# Patient Record
Sex: Male | Born: 1965
Health system: Southern US, Community
[De-identification: ages and names within clinical notes are randomized; demographics above are authoritative.]

## PROBLEM LIST (undated history)

## (undated) DIAGNOSIS — J309 Allergic rhinitis, unspecified: Secondary | ICD-10-CM

## (undated) HISTORY — PX: CYST EXCISION: SHX5701

## (undated) HISTORY — DX: Allergic rhinitis, unspecified: J30.9

---

## 2019-12-19 ENCOUNTER — Encounter: Payer: Self-pay | Admitting: Allergy and Immunology

## 2019-12-19 ENCOUNTER — Other Ambulatory Visit: Payer: Self-pay

## 2019-12-19 ENCOUNTER — Ambulatory Visit (INDEPENDENT_AMBULATORY_CARE_PROVIDER_SITE_OTHER): Payer: 59 | Admitting: Allergy and Immunology

## 2019-12-19 VITALS — BP 134/84 | HR 78 | Temp 98.1°F | Resp 18 | Ht 73.5 in | Wt 232.0 lb

## 2019-12-19 DIAGNOSIS — J301 Allergic rhinitis due to pollen: Secondary | ICD-10-CM

## 2019-12-19 DIAGNOSIS — H1010 Acute atopic conjunctivitis, unspecified eye: Secondary | ICD-10-CM

## 2019-12-19 DIAGNOSIS — J3089 Other allergic rhinitis: Secondary | ICD-10-CM | POA: Diagnosis not present

## 2019-12-19 MED ORDER — METHYLPREDNISOLONE ACETATE 80 MG/ML IJ SUSP
80.0000 mg | Freq: Once | INTRAMUSCULAR | Status: AC
  Administered 2019-12-19: 80 mg via INTRAMUSCULAR

## 2019-12-19 NOTE — Patient Instructions (Addendum)
  1.  Allergen avoidance measures.  Sports glasses when outdoors  2.  Treat and prevent inflammation:   A.  OTC Nasacort - 1-2 sprays each nostril 3-7 times a week  B.  Depo-Medrol 80 IM delivered in clinic today  3.  If needed:   A.  OTC antihistamine -cetirizine 10 mg 1 time per day  B.  OTC Pataday -1 drop each eye 1 time per day  4.  Consider a course of immunotherapy  5.  Obtain Covid vaccine when available  6.  Return to clinic in 6 months or earlier if problem

## 2019-12-19 NOTE — Progress Notes (Signed)
[place] - [place]   NEW PATIENT NOTE  Referring Provider: No ref. provider found Primary Provider: Nicoletta Dress, MD Date of office visit: 12/19/2019    Subjective:   Chief Complaint:  David Flores (DOB: [birth year]) is a 54 y.o. male who presents to the clinic on 12/19/2019 with a chief complaint of Allergy Testing .  HPI: David Flores presents to this clinic in evaluation of allergic disease.  For many years he has developed spring through fall nasal congestion and sneezing and itchy red watery eyes especially following exposure to pollen.  Unfortunately, because of his occupation he is exposed to extensive amounts of outdoor pollens and particulate matter throughout the year.  He has tried multiple medications in the past and has failed these medications.  He does not have any other associated atopic disease.  History reviewed. No pertinent past medical history.  History reviewed. No pertinent surgical history.  Allergies as of 12/19/2019   Not on File     Medication List    none    Review of systems negative except as noted in HPI / PMHx or noted below:  Review of Systems  Constitutional: Negative.   HENT: Negative.   Eyes: Negative.   Respiratory: Negative.   Cardiovascular: Negative.   Gastrointestinal: Negative.   Genitourinary: Negative.   Musculoskeletal: Negative.   Skin: Positive for rash (Rosacea treated at [facility] dermatology).  Neurological: Negative.   Endo/Heme/Allergies: Negative.   Psychiatric/Behavioral: Negative.     History reviewed. No pertinent family history.  Social History   Socioeconomic History  . Marital status: Married    Spouse name: Not on file  . Number of children: Not on file  . Years of education: Not on file  . Highest education level: Not on file  Occupational History  . Not on file  Tobacco Use  . Smoking status: Never Smoker  . Smokeless tobacco: Never Used  Substance and  Sexual Activity  . Alcohol use: Not Currently  . Drug use: Never  . Sexual activity: Not on file  Other Topics Concern  . Not on file  Social History Narrative  . Not on file    Environmental and Social history  Lives in a house with a dry environment, no animals located inside the household, carpet in the bedroom, no plastic on the bed, no plastic on the pillow, and employment as a Primary school teacher and hobbies including farming.  Objective:   Vitals:   12/19/19 0943  BP: 134/84  Pulse: 78  Resp: 18  Temp: 98.1 F (36.7 C)  SpO2: 100%   Height: 6' 1.5" (186.7 cm) Weight: 232 lb (105.2 kg)  Physical Exam Constitutional:      Appearance: He is not diaphoretic.  HENT:     Head: Normocephalic.     Right Ear: Tympanic membrane, ear canal and external ear normal.     Left Ear: Tympanic membrane, ear canal and external ear normal.     Nose: Mucosal edema present. No rhinorrhea.     Mouth/Throat:     Pharynx: Uvula midline. No oropharyngeal exudate.  Eyes:     Conjunctiva/sclera:     Right eye: Right conjunctiva is injected.     Left eye: Left conjunctiva is injected.  Neck:     Thyroid: No thyromegaly.     Trachea: Trachea normal. No tracheal tenderness or tracheal deviation.  Cardiovascular:     Rate and Rhythm: Normal rate and regular rhythm.  Heart sounds: Normal heart sounds, S1 normal and S2 normal. No murmur.  Pulmonary:     Effort: No respiratory distress.     Breath sounds: Normal breath sounds. No stridor. No wheezing or rales.  Lymphadenopathy:     Head:     Right side of head: No tonsillar adenopathy.     Left side of head: No tonsillar adenopathy.     Cervical: No cervical adenopathy.  Skin:    Findings: No erythema or rash.     Nails: There is no clubbing.  Neurological:     Mental Status: He is alert.     Diagnostics: Allergy skin tests were performed.  He demonstrated hypersensitivity to trees, grasses, weeds, dust mite, cat, dog, and  Alternaria.  Assessment and Plan:    1. Perennial allergic rhinitis   2. Seasonal allergic rhinitis due to pollen   3. Seasonal allergic conjunctivitis     1.  Allergen avoidance measures.  Sports glasses when outdoors  2.  Treat and prevent inflammation:   A.  OTC Nasacort - 1-2 sprays each nostril 3-7 times a week  B.  Depo-Medrol 80 IM delivered in clinic today  3.  If needed:   A.  OTC antihistamine -cetirizine 10 mg 1 time per day  B.  OTC Pataday -1 drop each eye 1 time per day  4.  Consider a course of immunotherapy  5.  Obtain Covid vaccine when available  6.  Return to clinic in 6 months or earlier if problem  David Flores appears to have significant atopic disease giving rise to inflammation of both his upper airway and eyes for which we will have him use a combination of anti-inflammatory agents and allergen avoidance measures as best as possible.  I have given him literature on immunotherapy during today's visit as he is already failed medical treatment.  He is presently considering this therapeutic option. I will see him back in his clinic in 6 months or earlier if there is a problem.  [name], MD Allergy / Immunology [place]

## 2020-01-08 ENCOUNTER — Other Ambulatory Visit: Payer: Self-pay | Admitting: Allergy and Immunology

## 2020-01-08 DIAGNOSIS — J301 Allergic rhinitis due to pollen: Secondary | ICD-10-CM

## 2020-01-08 DIAGNOSIS — J3089 Other allergic rhinitis: Secondary | ICD-10-CM

## 2020-01-08 NOTE — Progress Notes (Signed)
VIALS EXP 01-07-21

## 2020-01-13 DIAGNOSIS — J3089 Other allergic rhinitis: Secondary | ICD-10-CM | POA: Diagnosis not present

## 2020-01-27 ENCOUNTER — Ambulatory Visit (INDEPENDENT_AMBULATORY_CARE_PROVIDER_SITE_OTHER): Payer: 59 | Admitting: *Deleted

## 2020-01-27 ENCOUNTER — Other Ambulatory Visit: Payer: Self-pay

## 2020-01-27 DIAGNOSIS — J309 Allergic rhinitis, unspecified: Secondary | ICD-10-CM

## 2020-01-27 MED ORDER — EPINEPHRINE 0.3 MG/0.3ML IJ SOAJ: INTRAMUSCULAR | 3 refills | Status: AC

## 2020-01-27 NOTE — Progress Notes (Signed)
Immunotherapy   Patient Details  Name: David Flores MRN: [id number] Date of Birth: 1966-07-31  01/27/2020  Unknown [name] - GRASS/TREE - DMITE/WEED (EXP: 01/07/2021) Following schedule: B  Frequency: 1-2 TIMES WEEKLY Epi-Pen: YES Consent signed and patient instructions given.   [name] 01/27/2020, 8:53 AM

## 2020-02-03 ENCOUNTER — Ambulatory Visit (INDEPENDENT_AMBULATORY_CARE_PROVIDER_SITE_OTHER): Payer: 59 | Admitting: *Deleted

## 2020-02-03 DIAGNOSIS — J309 Allergic rhinitis, unspecified: Secondary | ICD-10-CM | POA: Diagnosis not present

## 2020-02-10 ENCOUNTER — Ambulatory Visit (INDEPENDENT_AMBULATORY_CARE_PROVIDER_SITE_OTHER): Payer: 59 | Admitting: *Deleted

## 2020-02-10 DIAGNOSIS — J309 Allergic rhinitis, unspecified: Secondary | ICD-10-CM

## 2020-02-17 ENCOUNTER — Ambulatory Visit (INDEPENDENT_AMBULATORY_CARE_PROVIDER_SITE_OTHER): Payer: 59 | Admitting: *Deleted

## 2020-02-17 DIAGNOSIS — J309 Allergic rhinitis, unspecified: Secondary | ICD-10-CM | POA: Diagnosis not present

## 2020-02-25 ENCOUNTER — Ambulatory Visit (INDEPENDENT_AMBULATORY_CARE_PROVIDER_SITE_OTHER): Payer: 59 | Admitting: *Deleted

## 2020-02-25 DIAGNOSIS — J309 Allergic rhinitis, unspecified: Secondary | ICD-10-CM

## 2020-03-03 ENCOUNTER — Ambulatory Visit (INDEPENDENT_AMBULATORY_CARE_PROVIDER_SITE_OTHER): Payer: 59 | Admitting: *Deleted

## 2020-03-03 DIAGNOSIS — J309 Allergic rhinitis, unspecified: Secondary | ICD-10-CM | POA: Diagnosis not present

## 2020-03-09 ENCOUNTER — Ambulatory Visit (INDEPENDENT_AMBULATORY_CARE_PROVIDER_SITE_OTHER): Payer: 59

## 2020-03-09 DIAGNOSIS — J309 Allergic rhinitis, unspecified: Secondary | ICD-10-CM

## 2020-03-17 ENCOUNTER — Ambulatory Visit (INDEPENDENT_AMBULATORY_CARE_PROVIDER_SITE_OTHER): Payer: 59

## 2020-03-17 DIAGNOSIS — J309 Allergic rhinitis, unspecified: Secondary | ICD-10-CM

## 2020-03-23 ENCOUNTER — Ambulatory Visit (INDEPENDENT_AMBULATORY_CARE_PROVIDER_SITE_OTHER): Payer: 59

## 2020-03-23 DIAGNOSIS — J309 Allergic rhinitis, unspecified: Secondary | ICD-10-CM

## 2020-03-31 ENCOUNTER — Ambulatory Visit (INDEPENDENT_AMBULATORY_CARE_PROVIDER_SITE_OTHER): Payer: 59 | Admitting: *Deleted

## 2020-03-31 DIAGNOSIS — J309 Allergic rhinitis, unspecified: Secondary | ICD-10-CM

## 2020-04-07 ENCOUNTER — Ambulatory Visit (INDEPENDENT_AMBULATORY_CARE_PROVIDER_SITE_OTHER): Payer: 59 | Admitting: *Deleted

## 2020-04-07 DIAGNOSIS — J309 Allergic rhinitis, unspecified: Secondary | ICD-10-CM | POA: Diagnosis not present

## 2020-04-13 ENCOUNTER — Ambulatory Visit (INDEPENDENT_AMBULATORY_CARE_PROVIDER_SITE_OTHER): Payer: 59 | Admitting: *Deleted

## 2020-04-13 DIAGNOSIS — J309 Allergic rhinitis, unspecified: Secondary | ICD-10-CM | POA: Diagnosis not present

## 2020-04-20 ENCOUNTER — Ambulatory Visit (INDEPENDENT_AMBULATORY_CARE_PROVIDER_SITE_OTHER): Payer: 59 | Admitting: *Deleted

## 2020-04-20 DIAGNOSIS — J309 Allergic rhinitis, unspecified: Secondary | ICD-10-CM

## 2020-04-27 ENCOUNTER — Ambulatory Visit (INDEPENDENT_AMBULATORY_CARE_PROVIDER_SITE_OTHER): Payer: 59 | Admitting: *Deleted

## 2020-04-27 DIAGNOSIS — J309 Allergic rhinitis, unspecified: Secondary | ICD-10-CM | POA: Diagnosis not present

## 2020-05-04 ENCOUNTER — Ambulatory Visit (INDEPENDENT_AMBULATORY_CARE_PROVIDER_SITE_OTHER): Payer: 59 | Admitting: *Deleted

## 2020-05-04 DIAGNOSIS — J309 Allergic rhinitis, unspecified: Secondary | ICD-10-CM

## 2020-05-11 ENCOUNTER — Ambulatory Visit (INDEPENDENT_AMBULATORY_CARE_PROVIDER_SITE_OTHER): Payer: 59 | Admitting: *Deleted

## 2020-05-11 DIAGNOSIS — J309 Allergic rhinitis, unspecified: Secondary | ICD-10-CM | POA: Diagnosis not present

## 2020-05-18 ENCOUNTER — Ambulatory Visit (INDEPENDENT_AMBULATORY_CARE_PROVIDER_SITE_OTHER): Payer: 59 | Admitting: *Deleted

## 2020-05-18 DIAGNOSIS — J309 Allergic rhinitis, unspecified: Secondary | ICD-10-CM

## 2020-05-25 ENCOUNTER — Ambulatory Visit (INDEPENDENT_AMBULATORY_CARE_PROVIDER_SITE_OTHER): Payer: 59 | Admitting: *Deleted

## 2020-05-25 DIAGNOSIS — J309 Allergic rhinitis, unspecified: Secondary | ICD-10-CM | POA: Diagnosis not present

## 2020-06-02 ENCOUNTER — Ambulatory Visit (INDEPENDENT_AMBULATORY_CARE_PROVIDER_SITE_OTHER): Payer: 59 | Admitting: *Deleted

## 2020-06-02 DIAGNOSIS — J309 Allergic rhinitis, unspecified: Secondary | ICD-10-CM | POA: Diagnosis not present

## 2020-06-08 ENCOUNTER — Ambulatory Visit (INDEPENDENT_AMBULATORY_CARE_PROVIDER_SITE_OTHER): Payer: 59 | Admitting: *Deleted

## 2020-06-08 DIAGNOSIS — J309 Allergic rhinitis, unspecified: Secondary | ICD-10-CM | POA: Diagnosis not present

## 2020-06-15 ENCOUNTER — Other Ambulatory Visit: Payer: Self-pay

## 2020-06-15 ENCOUNTER — Ambulatory Visit (INDEPENDENT_AMBULATORY_CARE_PROVIDER_SITE_OTHER): Payer: 59 | Admitting: Allergy and Immunology

## 2020-06-15 VITALS — BP 156/80 | HR 76 | Resp 18

## 2020-06-15 DIAGNOSIS — J309 Allergic rhinitis, unspecified: Secondary | ICD-10-CM | POA: Diagnosis not present

## 2020-06-15 DIAGNOSIS — J3089 Other allergic rhinitis: Secondary | ICD-10-CM | POA: Diagnosis not present

## 2020-06-15 DIAGNOSIS — H1010 Acute atopic conjunctivitis, unspecified eye: Secondary | ICD-10-CM | POA: Diagnosis not present

## 2020-06-15 DIAGNOSIS — J301 Allergic rhinitis due to pollen: Secondary | ICD-10-CM | POA: Diagnosis not present

## 2020-06-15 NOTE — Patient Instructions (Addendum)
  1.  Continue immunotherapy  2.  Obtain Covid vaccine and flu vaccine   3.  Return to clinic in 12 months or earlier if problem

## 2020-06-15 NOTE — Progress Notes (Signed)
[place] - [place] - [place]   Follow-up Note  Referring Provider: Nicoletta Dress, MD Primary Provider: Nicoletta Dress, MD Date of Office Visit: 06/15/2020  Subjective:   David Flores (DOB: [birth year]) is a 54 y.o. male who returns to the [facility] [place] on 06/15/2020 in re-evaluation of the following:  HPI: Yobani returns to this clinic in evaluation of allergic rhinoconjunctivitis.  His last visit to this clinic was 19 December 2019.  He started a course of immunotherapy which has been resulted in dramatic improvement as he goes through each season of the year regarding his atopic disease.  He does not need to use any medications at all and has completely resolved all the issues with both his nose and eyes as he goes through each season.  He does not need to use any medications at all.  Immunotherapy is currently at every week without any adverse effect.  Allergies as of 06/15/2020      Reactions   Iodine Other (See Comments)   "burns skin"   Penicillins       Medication List      EPINEPHrine 0.3 mg/0.3 mL Soaj injection Commonly known as: EPI-PEN Use as directed for life threatening allergic reactions       No past medical history on file.  No past surgical history on file.  Review of systems negative except as noted in HPI / PMHx or noted below:  Review of Systems  Constitutional: Negative.   HENT: Negative.   Eyes: Negative.   Respiratory: Negative.   Cardiovascular: Negative.   Gastrointestinal: Negative.   Genitourinary: Negative.   Musculoskeletal: Negative.   Skin: Negative.   Neurological: Negative.   Endo/Heme/Allergies: Negative.   Psychiatric/Behavioral: Negative.      Objective:   Vitals:   06/15/20 0850  BP: (!) 156/80  Pulse: 76  Resp: 18  SpO2: 98%          Physical Exam Constitutional:      Appearance: He is not diaphoretic.  HENT:     Head: Normocephalic.     Right Ear: Tympanic  membrane, ear canal and external ear normal.     Left Ear: Tympanic membrane, ear canal and external ear normal.     Nose: Nose normal. No mucosal edema or rhinorrhea.     Mouth/Throat:     Pharynx: Uvula midline. No oropharyngeal exudate.  Eyes:     Conjunctiva/sclera: Conjunctivae normal.  Neck:     Thyroid: No thyromegaly.     Trachea: Trachea normal. No tracheal tenderness or tracheal deviation.  Cardiovascular:     Rate and Rhythm: Normal rate and regular rhythm.     Heart sounds: Normal heart sounds, S1 normal and S2 normal. No murmur heard.   Pulmonary:     Effort: No respiratory distress.     Breath sounds: Normal breath sounds. No stridor. No wheezing or rales.  Lymphadenopathy:     Head:     Right side of head: No tonsillar adenopathy.     Left side of head: No tonsillar adenopathy.     Cervical: No cervical adenopathy.  Skin:    Findings: No erythema or rash.     Nails: There is no clubbing.  Neurological:     Mental Status: He is alert.     Diagnostics: none  Assessment and Plan:   1. Perennial allergic rhinitis   2. Seasonal allergic rhinitis due to pollen   3. Seasonal allergic  conjunctivitis     1.  Continue immunotherapy  2.  Obtain Covid vaccine and flu vaccine   3.  Return to clinic in 12 months or earlier if problem  Kashten appears to be doing very well while utilizing a course of immunotherapy directed against his atopic disease and he will continue on this form of treatment and I will see him back in his clinic in 1 year or earlier if there is a problem.  [name], MD Allergy / Immunology [place]

## 2020-06-16 ENCOUNTER — Encounter: Payer: Self-pay | Admitting: Allergy and Immunology

## 2020-06-22 ENCOUNTER — Ambulatory Visit (INDEPENDENT_AMBULATORY_CARE_PROVIDER_SITE_OTHER): Payer: 59

## 2020-06-22 DIAGNOSIS — J309 Allergic rhinitis, unspecified: Secondary | ICD-10-CM | POA: Diagnosis not present

## 2020-06-29 ENCOUNTER — Ambulatory Visit (INDEPENDENT_AMBULATORY_CARE_PROVIDER_SITE_OTHER): Payer: 59 | Admitting: *Deleted

## 2020-06-29 DIAGNOSIS — J309 Allergic rhinitis, unspecified: Secondary | ICD-10-CM

## 2020-07-07 ENCOUNTER — Ambulatory Visit (INDEPENDENT_AMBULATORY_CARE_PROVIDER_SITE_OTHER): Payer: 59 | Admitting: *Deleted

## 2020-07-07 DIAGNOSIS — J309 Allergic rhinitis, unspecified: Secondary | ICD-10-CM | POA: Diagnosis not present

## 2020-07-13 ENCOUNTER — Ambulatory Visit (INDEPENDENT_AMBULATORY_CARE_PROVIDER_SITE_OTHER): Payer: 59 | Admitting: *Deleted

## 2020-07-13 DIAGNOSIS — J309 Allergic rhinitis, unspecified: Secondary | ICD-10-CM | POA: Diagnosis not present

## 2020-07-14 DIAGNOSIS — J3089 Other allergic rhinitis: Secondary | ICD-10-CM | POA: Diagnosis not present

## 2020-07-14 NOTE — Progress Notes (Signed)
VIALS EXP 07-14-21

## 2020-07-21 ENCOUNTER — Ambulatory Visit (INDEPENDENT_AMBULATORY_CARE_PROVIDER_SITE_OTHER): Payer: 59

## 2020-07-21 DIAGNOSIS — J309 Allergic rhinitis, unspecified: Secondary | ICD-10-CM

## 2020-07-23 DIAGNOSIS — J302 Other seasonal allergic rhinitis: Secondary | ICD-10-CM | POA: Diagnosis not present

## 2020-07-23 NOTE — Progress Notes (Signed)
ADDITIONAL LABEL NEEDED 

## 2020-07-27 ENCOUNTER — Ambulatory Visit (INDEPENDENT_AMBULATORY_CARE_PROVIDER_SITE_OTHER): Payer: 59 | Admitting: *Deleted

## 2020-07-27 DIAGNOSIS — J309 Allergic rhinitis, unspecified: Secondary | ICD-10-CM

## 2020-08-10 ENCOUNTER — Ambulatory Visit (INDEPENDENT_AMBULATORY_CARE_PROVIDER_SITE_OTHER): Payer: 59

## 2020-08-10 DIAGNOSIS — J309 Allergic rhinitis, unspecified: Secondary | ICD-10-CM

## 2020-08-17 ENCOUNTER — Ambulatory Visit (INDEPENDENT_AMBULATORY_CARE_PROVIDER_SITE_OTHER): Payer: 59

## 2020-08-17 DIAGNOSIS — J309 Allergic rhinitis, unspecified: Secondary | ICD-10-CM

## 2020-08-24 ENCOUNTER — Ambulatory Visit (INDEPENDENT_AMBULATORY_CARE_PROVIDER_SITE_OTHER): Payer: 59 | Admitting: *Deleted

## 2020-08-24 DIAGNOSIS — J309 Allergic rhinitis, unspecified: Secondary | ICD-10-CM

## 2020-08-31 ENCOUNTER — Ambulatory Visit (INDEPENDENT_AMBULATORY_CARE_PROVIDER_SITE_OTHER): Payer: 59 | Admitting: *Deleted

## 2020-08-31 DIAGNOSIS — J309 Allergic rhinitis, unspecified: Secondary | ICD-10-CM

## 2020-09-07 ENCOUNTER — Ambulatory Visit (INDEPENDENT_AMBULATORY_CARE_PROVIDER_SITE_OTHER): Payer: 59 | Admitting: *Deleted

## 2020-09-07 DIAGNOSIS — J309 Allergic rhinitis, unspecified: Secondary | ICD-10-CM | POA: Diagnosis not present

## 2020-09-14 ENCOUNTER — Ambulatory Visit (INDEPENDENT_AMBULATORY_CARE_PROVIDER_SITE_OTHER): Payer: 59

## 2020-09-14 DIAGNOSIS — J309 Allergic rhinitis, unspecified: Secondary | ICD-10-CM | POA: Diagnosis not present

## 2020-09-29 ENCOUNTER — Ambulatory Visit (INDEPENDENT_AMBULATORY_CARE_PROVIDER_SITE_OTHER): Payer: 59

## 2020-09-29 DIAGNOSIS — J309 Allergic rhinitis, unspecified: Secondary | ICD-10-CM | POA: Diagnosis not present

## 2020-10-05 ENCOUNTER — Ambulatory Visit (INDEPENDENT_AMBULATORY_CARE_PROVIDER_SITE_OTHER): Payer: 59 | Admitting: *Deleted

## 2020-10-05 DIAGNOSIS — J309 Allergic rhinitis, unspecified: Secondary | ICD-10-CM | POA: Diagnosis not present

## 2020-10-06 NOTE — Progress Notes (Signed)
VIALS EXP 10-06-21 

## 2020-10-13 NOTE — Progress Notes (Signed)
ADDITIONAL LABEL NEEDED FOR BILLING

## 2020-10-15 ENCOUNTER — Ambulatory Visit (INDEPENDENT_AMBULATORY_CARE_PROVIDER_SITE_OTHER): Payer: 59

## 2020-10-15 DIAGNOSIS — J309 Allergic rhinitis, unspecified: Secondary | ICD-10-CM

## 2020-10-19 ENCOUNTER — Ambulatory Visit (INDEPENDENT_AMBULATORY_CARE_PROVIDER_SITE_OTHER): Payer: 59

## 2020-10-19 DIAGNOSIS — J309 Allergic rhinitis, unspecified: Secondary | ICD-10-CM | POA: Diagnosis not present

## 2020-11-03 ENCOUNTER — Ambulatory Visit (INDEPENDENT_AMBULATORY_CARE_PROVIDER_SITE_OTHER): Payer: 59

## 2020-11-03 DIAGNOSIS — J309 Allergic rhinitis, unspecified: Secondary | ICD-10-CM

## 2020-11-09 ENCOUNTER — Ambulatory Visit (INDEPENDENT_AMBULATORY_CARE_PROVIDER_SITE_OTHER): Payer: 59 | Admitting: *Deleted

## 2020-11-09 DIAGNOSIS — J309 Allergic rhinitis, unspecified: Secondary | ICD-10-CM

## 2020-11-16 ENCOUNTER — Ambulatory Visit (INDEPENDENT_AMBULATORY_CARE_PROVIDER_SITE_OTHER): Payer: 59 | Admitting: *Deleted

## 2020-11-16 DIAGNOSIS — J309 Allergic rhinitis, unspecified: Secondary | ICD-10-CM | POA: Diagnosis not present

## 2020-11-23 ENCOUNTER — Ambulatory Visit (INDEPENDENT_AMBULATORY_CARE_PROVIDER_SITE_OTHER): Payer: 59 | Admitting: *Deleted

## 2020-11-23 DIAGNOSIS — J309 Allergic rhinitis, unspecified: Secondary | ICD-10-CM

## 2020-11-25 ENCOUNTER — Other Ambulatory Visit: Payer: Self-pay | Admitting: Physician Assistant

## 2020-11-25 DIAGNOSIS — D487 Neoplasm of uncertain behavior of other specified sites: Secondary | ICD-10-CM

## 2020-11-30 ENCOUNTER — Ambulatory Visit (INDEPENDENT_AMBULATORY_CARE_PROVIDER_SITE_OTHER): Payer: 59 | Admitting: *Deleted

## 2020-11-30 DIAGNOSIS — J309 Allergic rhinitis, unspecified: Secondary | ICD-10-CM

## 2020-12-01 ENCOUNTER — Ambulatory Visit
Admission: RE | Admit: 2020-12-01 | Discharge: 2020-12-01 | Disposition: A | Discharge status: Home / Self Care | Payer: 59 | Source: Ambulatory Visit | Attending: Physician Assistant | Admitting: Physician Assistant

## 2020-12-01 DIAGNOSIS — D487 Neoplasm of uncertain behavior of other specified sites: Secondary | ICD-10-CM

## 2020-12-07 ENCOUNTER — Ambulatory Visit (INDEPENDENT_AMBULATORY_CARE_PROVIDER_SITE_OTHER): Payer: 59 | Admitting: *Deleted

## 2020-12-07 DIAGNOSIS — J309 Allergic rhinitis, unspecified: Secondary | ICD-10-CM

## 2020-12-14 ENCOUNTER — Ambulatory Visit (INDEPENDENT_AMBULATORY_CARE_PROVIDER_SITE_OTHER): Payer: 59 | Admitting: *Deleted

## 2020-12-14 DIAGNOSIS — J309 Allergic rhinitis, unspecified: Secondary | ICD-10-CM

## 2020-12-21 ENCOUNTER — Ambulatory Visit (INDEPENDENT_AMBULATORY_CARE_PROVIDER_SITE_OTHER): Payer: 59 | Admitting: *Deleted

## 2020-12-21 DIAGNOSIS — J309 Allergic rhinitis, unspecified: Secondary | ICD-10-CM | POA: Diagnosis not present

## 2021-01-04 ENCOUNTER — Ambulatory Visit (INDEPENDENT_AMBULATORY_CARE_PROVIDER_SITE_OTHER): Payer: 59 | Admitting: *Deleted

## 2021-01-04 DIAGNOSIS — J309 Allergic rhinitis, unspecified: Secondary | ICD-10-CM | POA: Diagnosis not present

## 2021-01-18 ENCOUNTER — Ambulatory Visit (INDEPENDENT_AMBULATORY_CARE_PROVIDER_SITE_OTHER): Payer: 59 | Admitting: *Deleted

## 2021-01-18 DIAGNOSIS — J309 Allergic rhinitis, unspecified: Secondary | ICD-10-CM | POA: Diagnosis not present

## 2021-01-20 DIAGNOSIS — J302 Other seasonal allergic rhinitis: Secondary | ICD-10-CM | POA: Diagnosis not present

## 2021-01-20 NOTE — Progress Notes (Addendum)
VIALS EXP 01-20-22.  LABELS FOR BILLING.

## 2021-01-25 DIAGNOSIS — J3089 Other allergic rhinitis: Secondary | ICD-10-CM | POA: Diagnosis not present

## 2021-02-01 ENCOUNTER — Ambulatory Visit (INDEPENDENT_AMBULATORY_CARE_PROVIDER_SITE_OTHER): Payer: 59 | Admitting: *Deleted

## 2021-02-01 DIAGNOSIS — J309 Allergic rhinitis, unspecified: Secondary | ICD-10-CM

## 2021-02-15 ENCOUNTER — Ambulatory Visit (INDEPENDENT_AMBULATORY_CARE_PROVIDER_SITE_OTHER): Payer: 59

## 2021-02-15 DIAGNOSIS — J309 Allergic rhinitis, unspecified: Secondary | ICD-10-CM

## 2021-03-02 ENCOUNTER — Ambulatory Visit (INDEPENDENT_AMBULATORY_CARE_PROVIDER_SITE_OTHER): Payer: BC Managed Care – PPO

## 2021-03-02 DIAGNOSIS — J309 Allergic rhinitis, unspecified: Secondary | ICD-10-CM | POA: Diagnosis not present

## 2021-03-15 ENCOUNTER — Ambulatory Visit (INDEPENDENT_AMBULATORY_CARE_PROVIDER_SITE_OTHER): Payer: BC Managed Care – PPO | Admitting: *Deleted

## 2021-03-15 DIAGNOSIS — J309 Allergic rhinitis, unspecified: Secondary | ICD-10-CM

## 2021-03-22 ENCOUNTER — Ambulatory Visit (INDEPENDENT_AMBULATORY_CARE_PROVIDER_SITE_OTHER): Payer: BC Managed Care – PPO | Admitting: *Deleted

## 2021-03-22 DIAGNOSIS — J309 Allergic rhinitis, unspecified: Secondary | ICD-10-CM | POA: Diagnosis not present

## 2021-03-31 ENCOUNTER — Ambulatory Visit (INDEPENDENT_AMBULATORY_CARE_PROVIDER_SITE_OTHER): Payer: BC Managed Care – PPO | Admitting: *Deleted

## 2021-03-31 DIAGNOSIS — J309 Allergic rhinitis, unspecified: Secondary | ICD-10-CM | POA: Diagnosis not present

## 2021-04-06 ENCOUNTER — Ambulatory Visit (INDEPENDENT_AMBULATORY_CARE_PROVIDER_SITE_OTHER): Payer: BC Managed Care – PPO | Admitting: *Deleted

## 2021-04-06 DIAGNOSIS — J309 Allergic rhinitis, unspecified: Secondary | ICD-10-CM | POA: Diagnosis not present

## 2021-04-13 ENCOUNTER — Ambulatory Visit (INDEPENDENT_AMBULATORY_CARE_PROVIDER_SITE_OTHER): Payer: BC Managed Care – PPO

## 2021-04-13 DIAGNOSIS — J309 Allergic rhinitis, unspecified: Secondary | ICD-10-CM

## 2021-04-26 ENCOUNTER — Ambulatory Visit (INDEPENDENT_AMBULATORY_CARE_PROVIDER_SITE_OTHER): Payer: BC Managed Care – PPO | Admitting: *Deleted

## 2021-04-26 DIAGNOSIS — J309 Allergic rhinitis, unspecified: Secondary | ICD-10-CM

## 2021-05-10 ENCOUNTER — Ambulatory Visit (INDEPENDENT_AMBULATORY_CARE_PROVIDER_SITE_OTHER): Payer: BC Managed Care – PPO | Admitting: *Deleted

## 2021-05-10 DIAGNOSIS — J309 Allergic rhinitis, unspecified: Secondary | ICD-10-CM

## 2021-05-19 DIAGNOSIS — J302 Other seasonal allergic rhinitis: Secondary | ICD-10-CM | POA: Diagnosis not present

## 2021-05-19 NOTE — Progress Notes (Signed)
VIALS MADE. EXP 05-19-22

## 2021-05-25 ENCOUNTER — Ambulatory Visit (INDEPENDENT_AMBULATORY_CARE_PROVIDER_SITE_OTHER): Payer: BC Managed Care – PPO | Admitting: *Deleted

## 2021-05-25 DIAGNOSIS — J309 Allergic rhinitis, unspecified: Secondary | ICD-10-CM

## 2021-06-07 ENCOUNTER — Ambulatory Visit (INDEPENDENT_AMBULATORY_CARE_PROVIDER_SITE_OTHER): Payer: BC Managed Care – PPO | Admitting: *Deleted

## 2021-06-07 DIAGNOSIS — J309 Allergic rhinitis, unspecified: Secondary | ICD-10-CM | POA: Diagnosis not present

## 2021-06-09 DIAGNOSIS — J3089 Other allergic rhinitis: Secondary | ICD-10-CM

## 2021-06-21 ENCOUNTER — Ambulatory Visit (INDEPENDENT_AMBULATORY_CARE_PROVIDER_SITE_OTHER): Payer: BC Managed Care – PPO | Admitting: *Deleted

## 2021-06-21 DIAGNOSIS — J309 Allergic rhinitis, unspecified: Secondary | ICD-10-CM | POA: Diagnosis not present

## 2021-07-05 ENCOUNTER — Ambulatory Visit (INDEPENDENT_AMBULATORY_CARE_PROVIDER_SITE_OTHER): Payer: BC Managed Care – PPO

## 2021-07-05 DIAGNOSIS — J309 Allergic rhinitis, unspecified: Secondary | ICD-10-CM | POA: Diagnosis not present

## 2021-07-12 ENCOUNTER — Ambulatory Visit (INDEPENDENT_AMBULATORY_CARE_PROVIDER_SITE_OTHER): Payer: BC Managed Care – PPO | Admitting: *Deleted

## 2021-07-12 DIAGNOSIS — J309 Allergic rhinitis, unspecified: Secondary | ICD-10-CM | POA: Diagnosis not present

## 2021-07-13 DIAGNOSIS — Z1331 Encounter for screening for depression: Secondary | ICD-10-CM | POA: Diagnosis not present

## 2021-07-13 DIAGNOSIS — Z0000 Encounter for general adult medical examination without abnormal findings: Secondary | ICD-10-CM | POA: Diagnosis not present

## 2021-07-19 ENCOUNTER — Ambulatory Visit (INDEPENDENT_AMBULATORY_CARE_PROVIDER_SITE_OTHER): Payer: BC Managed Care – PPO | Admitting: *Deleted

## 2021-07-19 DIAGNOSIS — J309 Allergic rhinitis, unspecified: Secondary | ICD-10-CM | POA: Diagnosis not present

## 2021-07-26 ENCOUNTER — Ambulatory Visit (INDEPENDENT_AMBULATORY_CARE_PROVIDER_SITE_OTHER): Payer: BC Managed Care – PPO

## 2021-07-26 DIAGNOSIS — J309 Allergic rhinitis, unspecified: Secondary | ICD-10-CM | POA: Diagnosis not present

## 2021-08-02 ENCOUNTER — Ambulatory Visit (INDEPENDENT_AMBULATORY_CARE_PROVIDER_SITE_OTHER): Payer: BC Managed Care – PPO

## 2021-08-02 DIAGNOSIS — J309 Allergic rhinitis, unspecified: Secondary | ICD-10-CM | POA: Diagnosis not present

## 2021-08-16 ENCOUNTER — Ambulatory Visit (INDEPENDENT_AMBULATORY_CARE_PROVIDER_SITE_OTHER): Payer: BC Managed Care – PPO

## 2021-08-16 DIAGNOSIS — J309 Allergic rhinitis, unspecified: Secondary | ICD-10-CM | POA: Diagnosis not present

## 2021-08-31 ENCOUNTER — Ambulatory Visit (INDEPENDENT_AMBULATORY_CARE_PROVIDER_SITE_OTHER): Payer: BC Managed Care – PPO | Admitting: *Deleted

## 2021-08-31 DIAGNOSIS — J309 Allergic rhinitis, unspecified: Secondary | ICD-10-CM | POA: Diagnosis not present

## 2021-09-09 DIAGNOSIS — J208 Acute bronchitis due to other specified organisms: Secondary | ICD-10-CM | POA: Diagnosis not present

## 2021-09-09 DIAGNOSIS — B9689 Other specified bacterial agents as the cause of diseases classified elsewhere: Secondary | ICD-10-CM | POA: Diagnosis not present

## 2021-09-09 DIAGNOSIS — J019 Acute sinusitis, unspecified: Secondary | ICD-10-CM | POA: Diagnosis not present

## 2021-09-13 ENCOUNTER — Ambulatory Visit (INDEPENDENT_AMBULATORY_CARE_PROVIDER_SITE_OTHER): Payer: BC Managed Care – PPO | Admitting: *Deleted

## 2021-09-13 DIAGNOSIS — J309 Allergic rhinitis, unspecified: Secondary | ICD-10-CM | POA: Diagnosis not present

## 2021-09-16 DIAGNOSIS — J3089 Other allergic rhinitis: Secondary | ICD-10-CM | POA: Diagnosis not present

## 2021-09-16 NOTE — Progress Notes (Signed)
VIALS MADE. EXP 09-16-22

## 2021-09-29 ENCOUNTER — Ambulatory Visit (INDEPENDENT_AMBULATORY_CARE_PROVIDER_SITE_OTHER): Payer: BC Managed Care – PPO | Admitting: *Deleted

## 2021-09-29 DIAGNOSIS — J309 Allergic rhinitis, unspecified: Secondary | ICD-10-CM

## 2021-10-11 ENCOUNTER — Ambulatory Visit (INDEPENDENT_AMBULATORY_CARE_PROVIDER_SITE_OTHER): Payer: BC Managed Care – PPO | Admitting: *Deleted

## 2021-10-11 DIAGNOSIS — J309 Allergic rhinitis, unspecified: Secondary | ICD-10-CM

## 2021-10-25 ENCOUNTER — Ambulatory Visit (INDEPENDENT_AMBULATORY_CARE_PROVIDER_SITE_OTHER): Payer: BC Managed Care – PPO | Admitting: *Deleted

## 2021-10-25 DIAGNOSIS — J309 Allergic rhinitis, unspecified: Secondary | ICD-10-CM

## 2021-10-26 ENCOUNTER — Telehealth: Payer: Self-pay | Admitting: Allergy and Immunology

## 2021-10-26 NOTE — Telephone Encounter (Signed)
Left voicemail to set up a follow up visit due to being on allergy injections.

## 2021-11-01 ENCOUNTER — Ambulatory Visit (INDEPENDENT_AMBULATORY_CARE_PROVIDER_SITE_OTHER): Payer: BC Managed Care – PPO | Admitting: *Deleted

## 2021-11-01 DIAGNOSIS — J309 Allergic rhinitis, unspecified: Secondary | ICD-10-CM | POA: Diagnosis not present

## 2021-11-08 ENCOUNTER — Encounter: Payer: Self-pay | Admitting: Allergy and Immunology

## 2021-11-08 ENCOUNTER — Other Ambulatory Visit: Payer: Self-pay

## 2021-11-08 ENCOUNTER — Ambulatory Visit: Payer: BC Managed Care – PPO | Admitting: Allergy and Immunology

## 2021-11-08 VITALS — BP 160/74 | HR 76 | Resp 14 | Ht 74.0 in | Wt 239.4 lb

## 2021-11-08 DIAGNOSIS — J301 Allergic rhinitis due to pollen: Secondary | ICD-10-CM

## 2021-11-08 DIAGNOSIS — J309 Allergic rhinitis, unspecified: Secondary | ICD-10-CM | POA: Diagnosis not present

## 2021-11-08 DIAGNOSIS — J3089 Other allergic rhinitis: Secondary | ICD-10-CM

## 2021-11-08 NOTE — Progress Notes (Signed)
[place] - [place] - [place]   Follow-up Note  Referring Provider: Nicoletta Dress, MD Primary Provider: Nicoletta Dress, MD Date of Office Visit: 11/08/2021  Subjective:   David Flores (DOB: 18-Nov-1965) is a 56 y.o. male who returns to the [facility] [place] on 11/08/2021 in re-evaluation of the following:  HPI: David Flores returns to this clinic in reevaluation of allergic rhinoconjunctivitis treated with immunotherapy.  His last visit to this clinic was 15 June 2020.  He continues to do very well with his immunotherapy currently using this form of treatment every 3 weeks without any adverse effect.  This treatment has resulted in almost complete elimination of all of his respiratory tract atopic symptomatology and he does not use any other medications for this issue at this point.  Allergies as of 11/08/2021       Reactions   Iodine Other (See Comments)   "burns skin"   Penicillins         Medication List    EPINEPHrine 0.3 mg/0.3 mL Soaj injection Commonly known as: EPI-PEN Use as directed for life threatening allergic reactions    Past Medical History:  Diagnosis Date   Allergic rhinitis     Past Surgical History:  Procedure Laterality Date   CYST EXCISION Right    Cyst under eyelid    Review of systems negative except as noted in HPI / PMHx or noted below:  Review of Systems  Constitutional: Negative.   HENT: Negative.    Eyes: Negative.   Respiratory: Negative.    Cardiovascular: Negative.   Gastrointestinal: Negative.   Genitourinary: Negative.   Musculoskeletal: Negative.   Skin: Negative.   Neurological: Negative.   Endo/Heme/Allergies: Negative.   Psychiatric/Behavioral: Negative.      Objective:   Vitals:   11/08/21 0850  BP: (!) 160/74  Pulse: 76  Resp: 14  SpO2: 98%   Height: 6\' 2"  (188 cm)  Weight: 239 lb 6.4 oz (108.6 kg)   Physical Exam Constitutional:      Appearance: He is not  diaphoretic.  HENT:     Head: Normocephalic.     Right Ear: Tympanic membrane, ear canal and external ear normal.     Left Ear: Tympanic membrane, ear canal and external ear normal.     Nose: Nose normal. No mucosal edema or rhinorrhea.     Mouth/Throat:     Pharynx: Uvula midline. No oropharyngeal exudate.  Eyes:     Conjunctiva/sclera: Conjunctivae normal.  Neck:     Thyroid: No thyromegaly.     Trachea: Trachea normal. No tracheal tenderness or tracheal deviation.  Cardiovascular:     Rate and Rhythm: Normal rate and regular rhythm.     Heart sounds: Normal heart sounds, S1 normal and S2 normal. No murmur heard. Pulmonary:     Effort: No respiratory distress.     Breath sounds: Normal breath sounds. No stridor. No wheezing or rales.  Lymphadenopathy:     Head:     Right side of head: No tonsillar adenopathy.     Left side of head: No tonsillar adenopathy.     Cervical: No cervical adenopathy.  Skin:    Findings: No erythema or rash.     Nails: There is no clubbing.  Neurological:     Mental Status: He is alert.    Diagnostics: none  Assessment and Plan:   1. Allergic rhinitis, unspecified seasonality, unspecified trigger   2. Perennial allergic rhinitis  3. Seasonal allergic rhinitis due to pollen     1.  Continue immunotherapy (and EpiPen)  2.  Return to clinic in 1 year or earlier if problem  David Flores has really done very well with his plan of immunotherapy and he will continue to use this form of treatment and we will see him back in this clinic in 1 year or earlier if there is a problem.  David Katz, MD Allergy / Immunology [place]

## 2021-11-08 NOTE — Patient Instructions (Signed)
°  1.  Continue immunotherapy (and EpiPen)  2.  Return to clinic in 1 year or earlier if problem

## 2021-11-09 ENCOUNTER — Encounter: Payer: Self-pay | Admitting: Allergy and Immunology

## 2021-11-15 ENCOUNTER — Ambulatory Visit (INDEPENDENT_AMBULATORY_CARE_PROVIDER_SITE_OTHER): Payer: BC Managed Care – PPO | Admitting: *Deleted

## 2021-11-15 DIAGNOSIS — J309 Allergic rhinitis, unspecified: Secondary | ICD-10-CM | POA: Diagnosis not present

## 2021-11-22 ENCOUNTER — Ambulatory Visit (INDEPENDENT_AMBULATORY_CARE_PROVIDER_SITE_OTHER): Payer: BC Managed Care – PPO | Admitting: *Deleted

## 2021-11-22 DIAGNOSIS — J309 Allergic rhinitis, unspecified: Secondary | ICD-10-CM

## 2021-12-06 ENCOUNTER — Ambulatory Visit (INDEPENDENT_AMBULATORY_CARE_PROVIDER_SITE_OTHER): Payer: BC Managed Care – PPO | Admitting: *Deleted

## 2021-12-06 DIAGNOSIS — J309 Allergic rhinitis, unspecified: Secondary | ICD-10-CM

## 2021-12-20 ENCOUNTER — Ambulatory Visit (INDEPENDENT_AMBULATORY_CARE_PROVIDER_SITE_OTHER): Payer: BC Managed Care – PPO | Admitting: *Deleted

## 2021-12-20 DIAGNOSIS — J309 Allergic rhinitis, unspecified: Secondary | ICD-10-CM | POA: Diagnosis not present

## 2022-01-03 ENCOUNTER — Ambulatory Visit (INDEPENDENT_AMBULATORY_CARE_PROVIDER_SITE_OTHER): Payer: BC Managed Care – PPO | Admitting: *Deleted

## 2022-01-03 DIAGNOSIS — J309 Allergic rhinitis, unspecified: Secondary | ICD-10-CM

## 2022-01-03 DIAGNOSIS — J019 Acute sinusitis, unspecified: Secondary | ICD-10-CM | POA: Diagnosis not present

## 2022-01-12 DIAGNOSIS — T1501XA Foreign body in cornea, right eye, initial encounter: Secondary | ICD-10-CM | POA: Diagnosis not present

## 2022-01-17 ENCOUNTER — Ambulatory Visit (INDEPENDENT_AMBULATORY_CARE_PROVIDER_SITE_OTHER): Payer: BC Managed Care – PPO | Admitting: *Deleted

## 2022-01-17 DIAGNOSIS — J309 Allergic rhinitis, unspecified: Secondary | ICD-10-CM | POA: Diagnosis not present

## 2022-01-26 DIAGNOSIS — J3089 Other allergic rhinitis: Secondary | ICD-10-CM | POA: Diagnosis not present

## 2022-01-26 NOTE — Progress Notes (Signed)
VIALS EXP 01-27-23 ?

## 2022-01-31 ENCOUNTER — Ambulatory Visit (INDEPENDENT_AMBULATORY_CARE_PROVIDER_SITE_OTHER): Payer: BC Managed Care – PPO | Admitting: *Deleted

## 2022-01-31 DIAGNOSIS — J309 Allergic rhinitis, unspecified: Secondary | ICD-10-CM | POA: Diagnosis not present

## 2022-02-14 ENCOUNTER — Ambulatory Visit (INDEPENDENT_AMBULATORY_CARE_PROVIDER_SITE_OTHER): Payer: BC Managed Care – PPO

## 2022-02-14 DIAGNOSIS — J309 Allergic rhinitis, unspecified: Secondary | ICD-10-CM | POA: Diagnosis not present

## 2022-03-01 ENCOUNTER — Ambulatory Visit (INDEPENDENT_AMBULATORY_CARE_PROVIDER_SITE_OTHER): Payer: BC Managed Care – PPO | Admitting: *Deleted

## 2022-03-01 DIAGNOSIS — J309 Allergic rhinitis, unspecified: Secondary | ICD-10-CM

## 2022-03-02 DIAGNOSIS — R0789 Other chest pain: Secondary | ICD-10-CM | POA: Diagnosis not present

## 2022-03-02 DIAGNOSIS — R030 Elevated blood-pressure reading, without diagnosis of hypertension: Secondary | ICD-10-CM | POA: Diagnosis not present

## 2022-03-07 DIAGNOSIS — R0789 Other chest pain: Secondary | ICD-10-CM | POA: Diagnosis not present

## 2022-03-07 DIAGNOSIS — I160 Hypertensive urgency: Secondary | ICD-10-CM | POA: Diagnosis not present

## 2022-03-14 ENCOUNTER — Ambulatory Visit (INDEPENDENT_AMBULATORY_CARE_PROVIDER_SITE_OTHER): Payer: BC Managed Care – PPO | Admitting: *Deleted

## 2022-03-14 DIAGNOSIS — J309 Allergic rhinitis, unspecified: Secondary | ICD-10-CM | POA: Diagnosis not present

## 2022-03-15 DIAGNOSIS — I10 Essential (primary) hypertension: Secondary | ICD-10-CM | POA: Diagnosis not present

## 2022-03-15 DIAGNOSIS — R0789 Other chest pain: Secondary | ICD-10-CM | POA: Diagnosis not present

## 2022-03-15 DIAGNOSIS — R079 Chest pain, unspecified: Secondary | ICD-10-CM | POA: Diagnosis not present

## 2022-03-31 ENCOUNTER — Ambulatory Visit (INDEPENDENT_AMBULATORY_CARE_PROVIDER_SITE_OTHER): Payer: BC Managed Care – PPO | Admitting: *Deleted

## 2022-03-31 DIAGNOSIS — J309 Allergic rhinitis, unspecified: Secondary | ICD-10-CM | POA: Diagnosis not present

## 2022-04-08 DIAGNOSIS — I10 Essential (primary) hypertension: Secondary | ICD-10-CM | POA: Diagnosis not present

## 2022-04-11 ENCOUNTER — Ambulatory Visit (INDEPENDENT_AMBULATORY_CARE_PROVIDER_SITE_OTHER): Payer: BC Managed Care – PPO | Admitting: *Deleted

## 2022-04-11 DIAGNOSIS — J309 Allergic rhinitis, unspecified: Secondary | ICD-10-CM | POA: Diagnosis not present

## 2022-04-18 ENCOUNTER — Ambulatory Visit (INDEPENDENT_AMBULATORY_CARE_PROVIDER_SITE_OTHER): Payer: BC Managed Care – PPO | Admitting: *Deleted

## 2022-04-18 DIAGNOSIS — J309 Allergic rhinitis, unspecified: Secondary | ICD-10-CM | POA: Diagnosis not present

## 2022-05-09 ENCOUNTER — Ambulatory Visit (INDEPENDENT_AMBULATORY_CARE_PROVIDER_SITE_OTHER): Payer: BC Managed Care – PPO | Admitting: *Deleted

## 2022-05-09 DIAGNOSIS — J309 Allergic rhinitis, unspecified: Secondary | ICD-10-CM | POA: Diagnosis not present

## 2022-05-31 ENCOUNTER — Ambulatory Visit (INDEPENDENT_AMBULATORY_CARE_PROVIDER_SITE_OTHER): Payer: BC Managed Care – PPO | Admitting: *Deleted

## 2022-05-31 DIAGNOSIS — J309 Allergic rhinitis, unspecified: Secondary | ICD-10-CM

## 2022-06-20 ENCOUNTER — Ambulatory Visit (INDEPENDENT_AMBULATORY_CARE_PROVIDER_SITE_OTHER): Payer: BC Managed Care – PPO | Admitting: *Deleted

## 2022-06-20 DIAGNOSIS — J309 Allergic rhinitis, unspecified: Secondary | ICD-10-CM | POA: Diagnosis not present

## 2022-07-06 DIAGNOSIS — J3089 Other allergic rhinitis: Secondary | ICD-10-CM | POA: Diagnosis not present

## 2022-07-06 NOTE — Progress Notes (Signed)
VIALS EXP 07-07-23

## 2022-07-13 ENCOUNTER — Ambulatory Visit (INDEPENDENT_AMBULATORY_CARE_PROVIDER_SITE_OTHER): Payer: BC Managed Care – PPO | Admitting: *Deleted

## 2022-07-13 DIAGNOSIS — J309 Allergic rhinitis, unspecified: Secondary | ICD-10-CM

## 2022-07-14 DIAGNOSIS — Z6831 Body mass index (BMI) 31.0-31.9, adult: Secondary | ICD-10-CM | POA: Diagnosis not present

## 2022-07-14 DIAGNOSIS — Z0000 Encounter for general adult medical examination without abnormal findings: Secondary | ICD-10-CM | POA: Diagnosis not present

## 2022-08-22 ENCOUNTER — Ambulatory Visit (INDEPENDENT_AMBULATORY_CARE_PROVIDER_SITE_OTHER): Payer: BC Managed Care – PPO | Admitting: *Deleted

## 2022-08-22 DIAGNOSIS — J309 Allergic rhinitis, unspecified: Secondary | ICD-10-CM | POA: Diagnosis not present

## 2022-09-12 ENCOUNTER — Ambulatory Visit (INDEPENDENT_AMBULATORY_CARE_PROVIDER_SITE_OTHER): Payer: BC Managed Care – PPO | Admitting: *Deleted

## 2022-09-12 DIAGNOSIS — J309 Allergic rhinitis, unspecified: Secondary | ICD-10-CM

## 2022-10-03 ENCOUNTER — Ambulatory Visit (INDEPENDENT_AMBULATORY_CARE_PROVIDER_SITE_OTHER): Payer: BC Managed Care – PPO | Admitting: *Deleted

## 2022-10-03 DIAGNOSIS — J309 Allergic rhinitis, unspecified: Secondary | ICD-10-CM | POA: Diagnosis not present

## 2022-10-09 IMAGING — CT CT ORBITS W/O CM
3 series · 14 of 47 positions shown, 16 images · non-contrast
Comparison: None.

CLINICAL DATA: Right eye swelling

EXAM:
CT ORBITS WITHOUT CONTRAST
TECHNIQUE: Multidetector CT images were obtained using the standard protocol
without intravenous contrast.

[Series 3: orbits 2.00 hr36 s3 axial soft · axial · 0.43mm/px · z∈[-701,-591]mm · 8 of 65 slices shown, 10 images]
[im 5/65  brain]
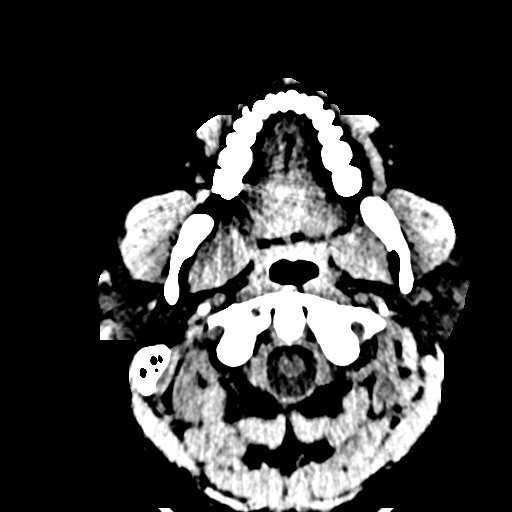
[im 5/65  bone]
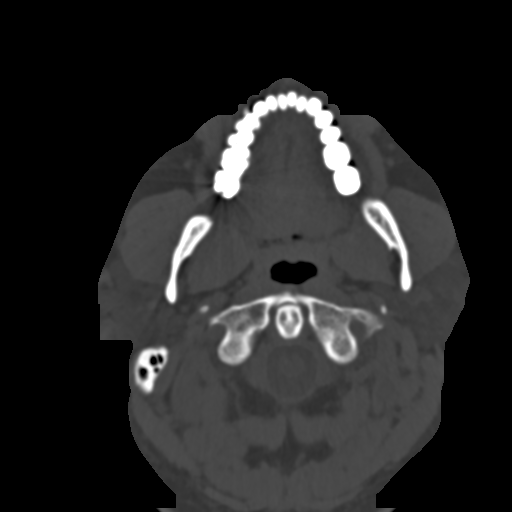
[im 14/65  bone]
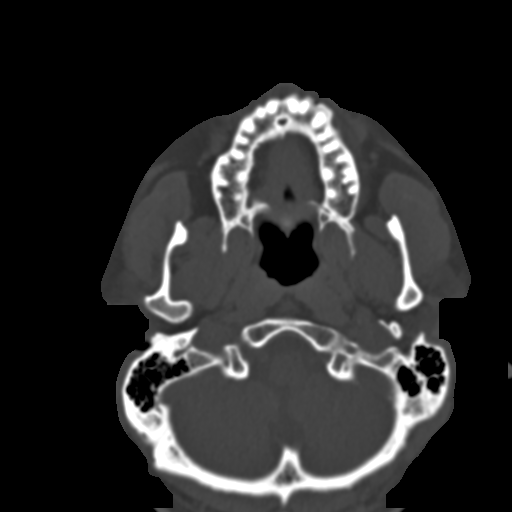
[im 20/65  bone]
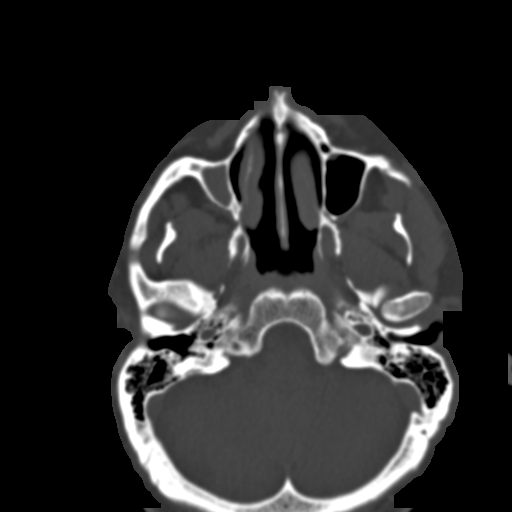
[im 29/65  bone]
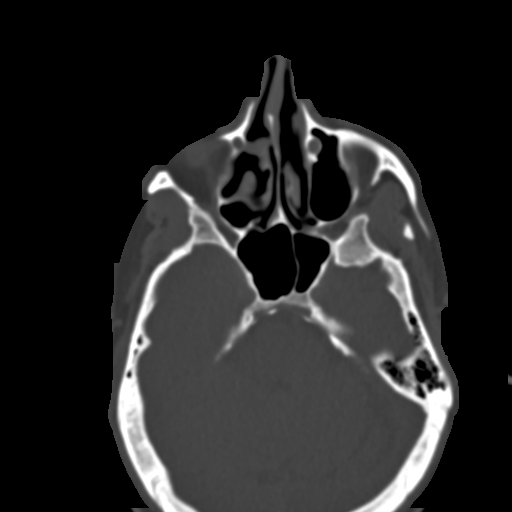
[im 36/65  brain]
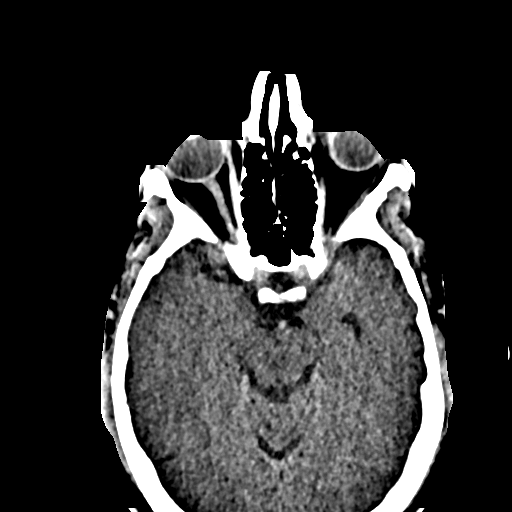
[im 36/65  bone]
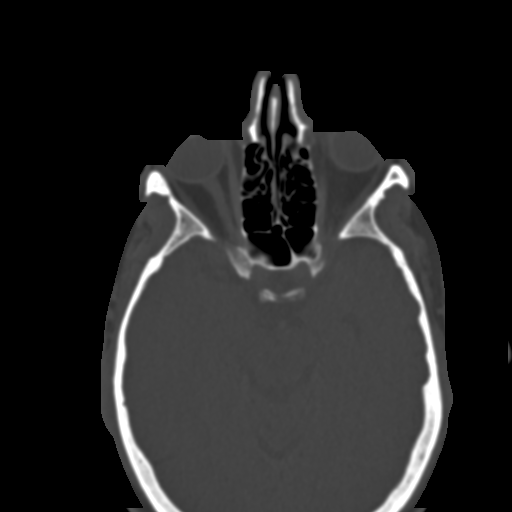
[im 45/65  bone]
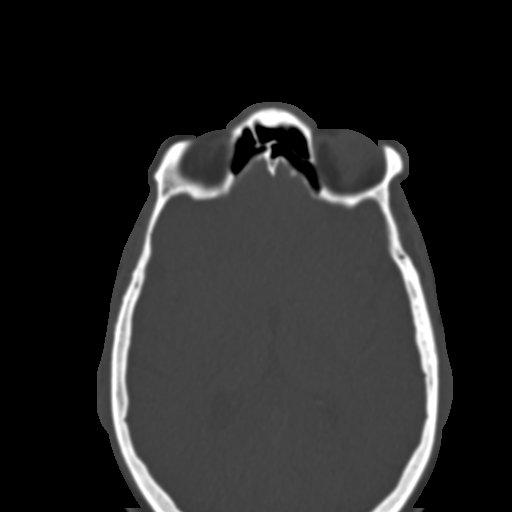
[im 51/65  bone]
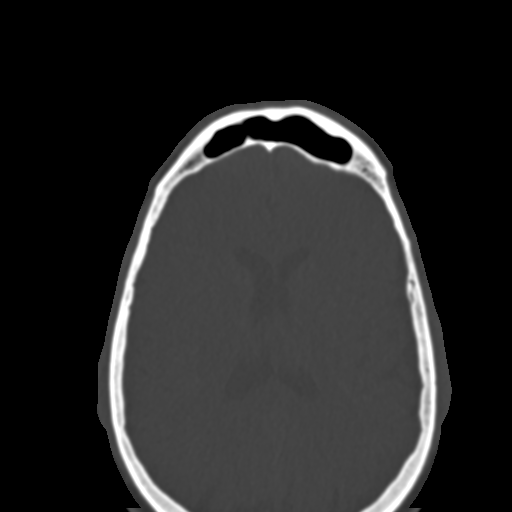
[im 60/65  bone]
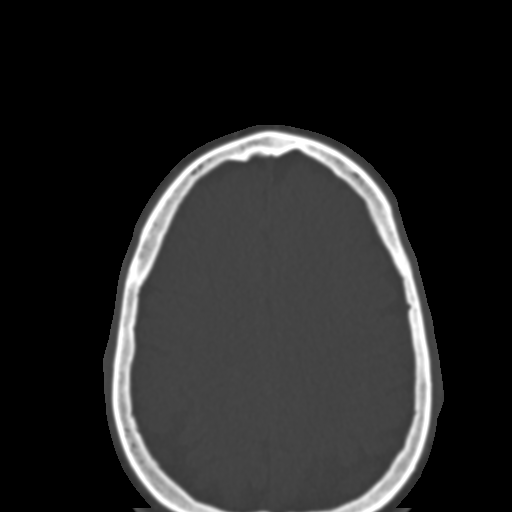

[Series 6: orbits 2.00 hr36 s3 cor soft · coronal · 0.25mm/px · 3 of 103 slices shown]
[im 35/103  bone]
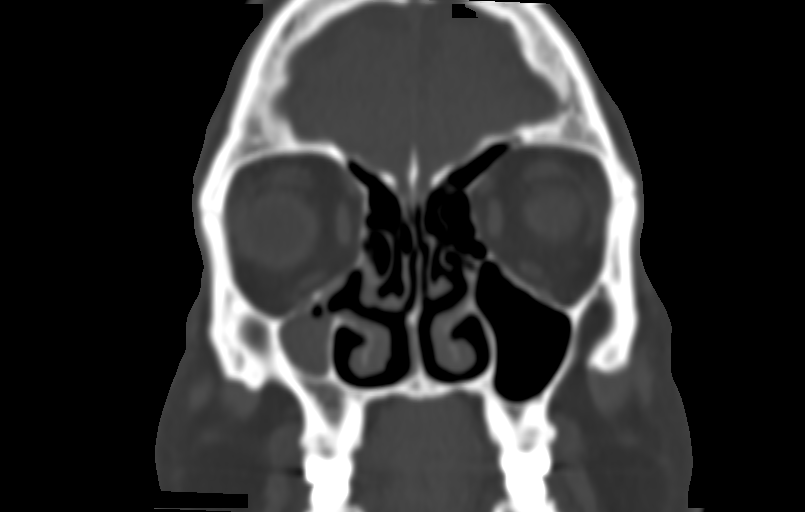
[im 46/103  bone]
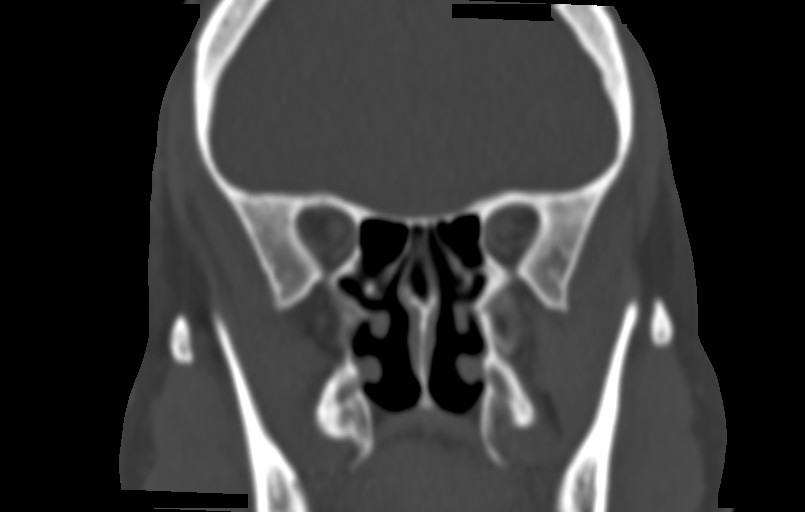
[im 57/103  bone]
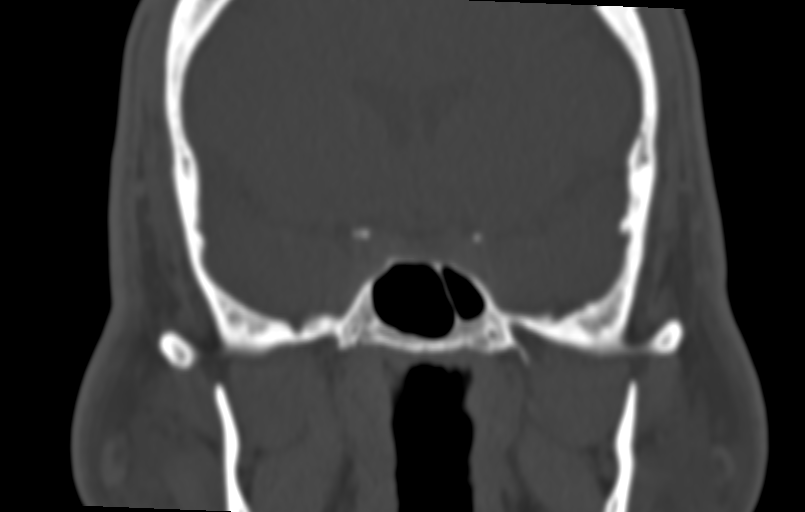

[Series 10: orbits 2.00 hr36 s3 sag soft · sagittal · 0.25mm/px · 3 of 94 slices shown]
[im 32/94  bone]
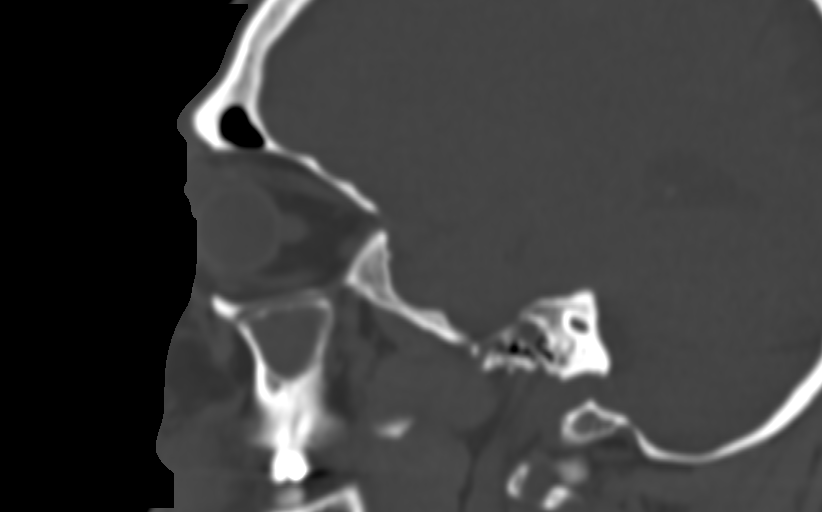
[im 47/94  bone]
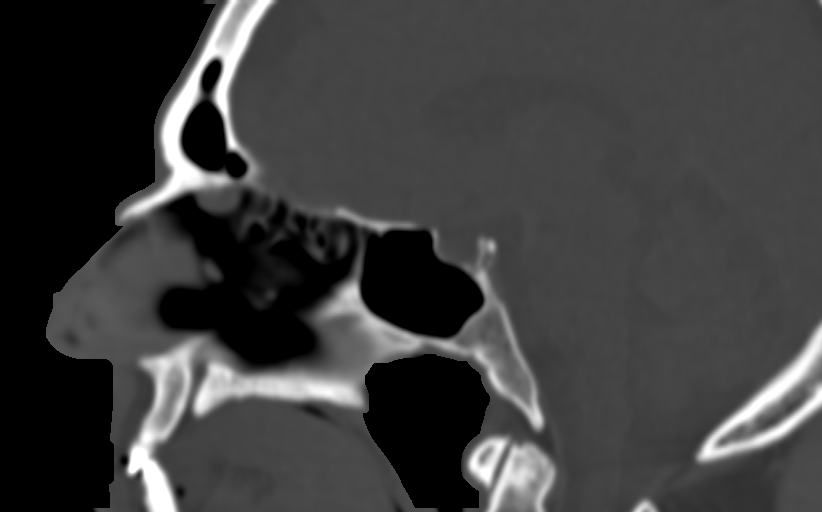
[im 63/94  bone]
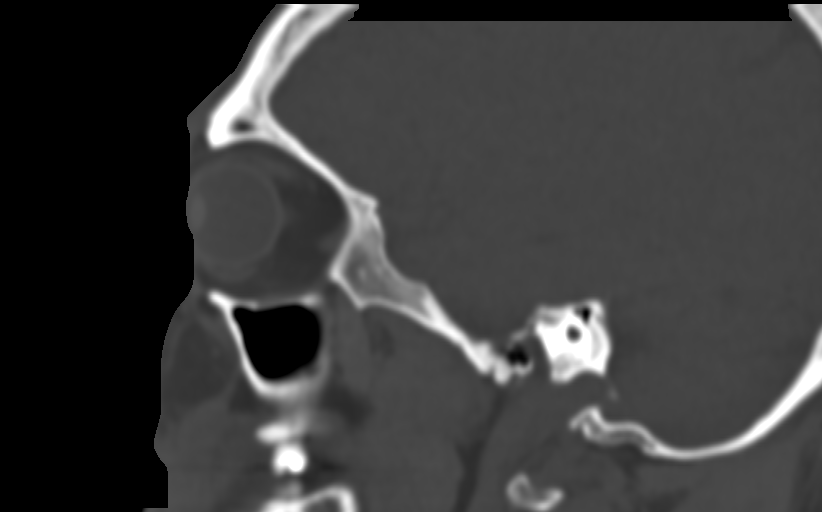

[14 of 47 positions shown; findings below may reference images not displayed]

FINDINGS: Orbits: There is a low-density focus at the superomedial right
eyelid that measures 8 x 17 x 10 mm. Orbits are otherwise normal.

Visualized sinuses: Right maxillary sinus atelectasis.

Soft tissues: Normal

Limited intracranial: Normal
IMPRESSION: 1. Superficial, low-density focus at the superomedial right eyelid
that measures 8 x 17 x 10 mm, favored to be a chalazion or inclusion
cyst.

## 2022-10-10 DIAGNOSIS — E291 Testicular hypofunction: Secondary | ICD-10-CM | POA: Diagnosis not present

## 2022-10-10 DIAGNOSIS — I10 Essential (primary) hypertension: Secondary | ICD-10-CM | POA: Diagnosis not present

## 2022-10-10 DIAGNOSIS — Z1331 Encounter for screening for depression: Secondary | ICD-10-CM | POA: Diagnosis not present

## 2022-10-10 DIAGNOSIS — E785 Hyperlipidemia, unspecified: Secondary | ICD-10-CM | POA: Diagnosis not present

## 2022-10-10 DIAGNOSIS — R7301 Impaired fasting glucose: Secondary | ICD-10-CM | POA: Diagnosis not present

## 2022-10-17 ENCOUNTER — Ambulatory Visit (INDEPENDENT_AMBULATORY_CARE_PROVIDER_SITE_OTHER): Payer: BC Managed Care – PPO | Admitting: *Deleted

## 2022-10-17 DIAGNOSIS — J309 Allergic rhinitis, unspecified: Secondary | ICD-10-CM | POA: Diagnosis not present

## 2022-10-31 ENCOUNTER — Ambulatory Visit (INDEPENDENT_AMBULATORY_CARE_PROVIDER_SITE_OTHER): Payer: BC Managed Care – PPO | Admitting: *Deleted

## 2022-10-31 DIAGNOSIS — J309 Allergic rhinitis, unspecified: Secondary | ICD-10-CM

## 2022-11-14 ENCOUNTER — Ambulatory Visit (INDEPENDENT_AMBULATORY_CARE_PROVIDER_SITE_OTHER): Payer: BC Managed Care – PPO | Admitting: *Deleted

## 2022-11-14 DIAGNOSIS — J309 Allergic rhinitis, unspecified: Secondary | ICD-10-CM

## 2022-11-28 ENCOUNTER — Ambulatory Visit (INDEPENDENT_AMBULATORY_CARE_PROVIDER_SITE_OTHER): Payer: BC Managed Care – PPO | Admitting: *Deleted

## 2022-11-28 DIAGNOSIS — J309 Allergic rhinitis, unspecified: Secondary | ICD-10-CM

## 2022-12-19 ENCOUNTER — Ambulatory Visit (INDEPENDENT_AMBULATORY_CARE_PROVIDER_SITE_OTHER): Payer: BC Managed Care – PPO | Admitting: *Deleted

## 2022-12-19 DIAGNOSIS — J309 Allergic rhinitis, unspecified: Secondary | ICD-10-CM | POA: Diagnosis not present

## 2023-01-09 ENCOUNTER — Ambulatory Visit (INDEPENDENT_AMBULATORY_CARE_PROVIDER_SITE_OTHER): Payer: BC Managed Care – PPO | Admitting: *Deleted

## 2023-01-09 DIAGNOSIS — J309 Allergic rhinitis, unspecified: Secondary | ICD-10-CM

## 2023-01-12 DIAGNOSIS — L578 Other skin changes due to chronic exposure to nonionizing radiation: Secondary | ICD-10-CM | POA: Diagnosis not present

## 2023-01-12 DIAGNOSIS — D224 Melanocytic nevi of scalp and neck: Secondary | ICD-10-CM | POA: Diagnosis not present

## 2023-01-12 DIAGNOSIS — L821 Other seborrheic keratosis: Secondary | ICD-10-CM | POA: Diagnosis not present

## 2023-01-12 DIAGNOSIS — L820 Inflamed seborrheic keratosis: Secondary | ICD-10-CM | POA: Diagnosis not present

## 2023-01-30 ENCOUNTER — Ambulatory Visit (INDEPENDENT_AMBULATORY_CARE_PROVIDER_SITE_OTHER): Payer: BC Managed Care – PPO | Admitting: *Deleted

## 2023-01-30 DIAGNOSIS — J309 Allergic rhinitis, unspecified: Secondary | ICD-10-CM

## 2023-02-06 DIAGNOSIS — J301 Allergic rhinitis due to pollen: Secondary | ICD-10-CM | POA: Diagnosis not present

## 2023-02-06 NOTE — Progress Notes (Signed)
VIALS EXP 02-06-24 

## 2023-02-21 ENCOUNTER — Ambulatory Visit (INDEPENDENT_AMBULATORY_CARE_PROVIDER_SITE_OTHER): Payer: BC Managed Care – PPO | Admitting: *Deleted

## 2023-02-21 DIAGNOSIS — J309 Allergic rhinitis, unspecified: Secondary | ICD-10-CM

## 2023-03-13 ENCOUNTER — Ambulatory Visit (INDEPENDENT_AMBULATORY_CARE_PROVIDER_SITE_OTHER): Payer: BC Managed Care – PPO | Admitting: *Deleted

## 2023-03-13 DIAGNOSIS — J309 Allergic rhinitis, unspecified: Secondary | ICD-10-CM

## 2023-04-03 ENCOUNTER — Ambulatory Visit (INDEPENDENT_AMBULATORY_CARE_PROVIDER_SITE_OTHER): Payer: Self-pay | Admitting: *Deleted

## 2023-04-03 DIAGNOSIS — J309 Allergic rhinitis, unspecified: Secondary | ICD-10-CM | POA: Diagnosis not present

## 2023-04-10 DIAGNOSIS — E785 Hyperlipidemia, unspecified: Secondary | ICD-10-CM | POA: Diagnosis not present

## 2023-04-10 DIAGNOSIS — I10 Essential (primary) hypertension: Secondary | ICD-10-CM | POA: Diagnosis not present

## 2023-04-10 DIAGNOSIS — E291 Testicular hypofunction: Secondary | ICD-10-CM | POA: Diagnosis not present

## 2023-04-10 DIAGNOSIS — R7301 Impaired fasting glucose: Secondary | ICD-10-CM | POA: Diagnosis not present

## 2023-05-03 ENCOUNTER — Ambulatory Visit (INDEPENDENT_AMBULATORY_CARE_PROVIDER_SITE_OTHER): Payer: BC Managed Care – PPO | Admitting: *Deleted

## 2023-05-03 DIAGNOSIS — J309 Allergic rhinitis, unspecified: Secondary | ICD-10-CM

## 2023-05-16 ENCOUNTER — Ambulatory Visit (INDEPENDENT_AMBULATORY_CARE_PROVIDER_SITE_OTHER): Payer: BC Managed Care – PPO | Admitting: *Deleted

## 2023-05-16 DIAGNOSIS — J309 Allergic rhinitis, unspecified: Secondary | ICD-10-CM

## 2023-05-30 DIAGNOSIS — M79642 Pain in left hand: Secondary | ICD-10-CM | POA: Diagnosis not present

## 2023-05-30 DIAGNOSIS — M7661 Achilles tendinitis, right leg: Secondary | ICD-10-CM | POA: Diagnosis not present

## 2023-06-05 ENCOUNTER — Ambulatory Visit (INDEPENDENT_AMBULATORY_CARE_PROVIDER_SITE_OTHER): Payer: BC Managed Care – PPO | Admitting: *Deleted

## 2023-06-05 DIAGNOSIS — J309 Allergic rhinitis, unspecified: Secondary | ICD-10-CM | POA: Diagnosis not present

## 2023-06-19 ENCOUNTER — Ambulatory Visit (INDEPENDENT_AMBULATORY_CARE_PROVIDER_SITE_OTHER): Payer: BC Managed Care – PPO | Admitting: *Deleted

## 2023-06-19 DIAGNOSIS — J309 Allergic rhinitis, unspecified: Secondary | ICD-10-CM | POA: Diagnosis not present

## 2023-07-05 ENCOUNTER — Ambulatory Visit (INDEPENDENT_AMBULATORY_CARE_PROVIDER_SITE_OTHER): Payer: Self-pay

## 2023-07-05 DIAGNOSIS — J309 Allergic rhinitis, unspecified: Secondary | ICD-10-CM | POA: Diagnosis not present

## 2023-07-17 DIAGNOSIS — Z0000 Encounter for general adult medical examination without abnormal findings: Secondary | ICD-10-CM | POA: Diagnosis not present

## 2023-07-17 DIAGNOSIS — Z6831 Body mass index (BMI) 31.0-31.9, adult: Secondary | ICD-10-CM | POA: Diagnosis not present

## 2023-07-27 ENCOUNTER — Ambulatory Visit (INDEPENDENT_AMBULATORY_CARE_PROVIDER_SITE_OTHER): Payer: BC Managed Care – PPO | Admitting: *Deleted

## 2023-07-27 DIAGNOSIS — J309 Allergic rhinitis, unspecified: Secondary | ICD-10-CM | POA: Diagnosis not present

## 2023-08-22 ENCOUNTER — Ambulatory Visit (INDEPENDENT_AMBULATORY_CARE_PROVIDER_SITE_OTHER): Payer: BC Managed Care – PPO

## 2023-08-22 DIAGNOSIS — J309 Allergic rhinitis, unspecified: Secondary | ICD-10-CM

## 2023-09-14 ENCOUNTER — Ambulatory Visit (INDEPENDENT_AMBULATORY_CARE_PROVIDER_SITE_OTHER): Payer: BC Managed Care – PPO | Admitting: *Deleted

## 2023-09-14 DIAGNOSIS — J309 Allergic rhinitis, unspecified: Secondary | ICD-10-CM | POA: Diagnosis not present

## 2023-10-16 ENCOUNTER — Ambulatory Visit (INDEPENDENT_AMBULATORY_CARE_PROVIDER_SITE_OTHER): Payer: Self-pay | Admitting: *Deleted

## 2023-10-16 DIAGNOSIS — J309 Allergic rhinitis, unspecified: Secondary | ICD-10-CM | POA: Diagnosis not present

## 2023-11-14 ENCOUNTER — Ambulatory Visit (INDEPENDENT_AMBULATORY_CARE_PROVIDER_SITE_OTHER): Payer: Self-pay | Admitting: *Deleted

## 2023-11-14 DIAGNOSIS — J309 Allergic rhinitis, unspecified: Secondary | ICD-10-CM

## 2023-11-20 DIAGNOSIS — J3089 Other allergic rhinitis: Secondary | ICD-10-CM | POA: Diagnosis not present

## 2023-11-20 NOTE — Progress Notes (Signed)
 VIAL ONE MADE 11-20-23. EXP 11-19-24

## 2023-11-21 NOTE — Progress Notes (Signed)
 VIAL 2 MADE 11-21-23. EXP 11-20-24

## 2023-11-24 ENCOUNTER — Ambulatory Visit (INDEPENDENT_AMBULATORY_CARE_PROVIDER_SITE_OTHER): Payer: BC Managed Care – PPO

## 2023-11-24 ENCOUNTER — Ambulatory Visit: Payer: BC Managed Care – PPO | Admitting: Podiatry

## 2023-11-24 DIAGNOSIS — M778 Other enthesopathies, not elsewhere classified: Secondary | ICD-10-CM

## 2023-11-24 DIAGNOSIS — M7661 Achilles tendinitis, right leg: Secondary | ICD-10-CM | POA: Diagnosis not present

## 2023-11-24 MED ORDER — MELOXICAM 15 MG PO TABS: 15.0000 mg | ORAL_TABLET | Freq: Every day | ORAL | 0 refills | Status: DC

## 2023-11-24 NOTE — Progress Notes (Signed)
 Chief Complaint  Patient presents with   Foot Pain    Right heel pain, into the achillis. He does have heel pain on the bottom of his heel as well.  Started 8 months ago and has progressivly gotten worse. He stated that [place] it feels like a bone bruise. Not diabetic and no anti caog.    HPI: 58 y.o. male presenting today with c/o pain in the back of the right ankle area along the Achilles tendon.  Present since last April.  Denies trauma.  States it might have began after walking on the beach.  Did not feel a pop or snap in the area.  Does not have pain in the area with for steps out of bed in the morning.  He notes has been going on so long he is noticing some pain towards the bottom of the heel.  He does wear boots that are a mid top that stopped right at the "lump".  He is not sure whether this is aggravating it or not.  Past Medical History:  Diagnosis Date   Allergic rhinitis     Past Surgical History:  Procedure Laterality Date   CYST EXCISION Right    Cyst under eyelid    Allergies  Allergen Reactions   Iodine Other (See Comments)    "burns skin"   Penicillins      Physical Exam: General: The patient is alert and oriented x3 in no acute distress.  Dermatology:  No ecchymosis, erythema, or edema bilateral.  No open lesions.    Vascular: Palpable pedal pulses bilaterally. Capillary refill within normal limits.  No appreciable edema.    Neurological: Light touch sensation intact bilateral.  MMT 5/5 to lower extremity bilateral. Negative Tinel's sign with percussion of the posterior tibial nerve on the affected extremity.    Musculoskeletal Exam:  There is minimal pain on palpation of the posterior aspect of the right calcaneus.  There is a palpable thickness in the right Achilles tendon approximately 5 cm superior to its insertion.  There is moderate pain on palpation to this mass/nodule.  Minimal pain on palpation of the plantar heel.  No ecchymosis  or edema to posterior heel.  Radiographic Exam( right heel 2 views 11/24/2023):  Normal osseous mineralization. Joint spaces preserved.  Tiny posterior calcaneal spur noted.  No fracture seen.  There is soft tissue thickening noted along the Achilles tendon approximately 5 cm superior to the insertion onto the calcaneus  Assessment/Plan of Care: 1. Right Achilles tendinitis   2. Capsulitis of right foot     Meds ordered this encounter  Medications   meloxicam (MOBIC) 15 MG tablet    Sig: Take 1 tablet (15 mg total) by mouth daily.    Dispense:  30 tablet    Refill:  0   -Reviewed etiology of achilles tendonitis with patient.  Discussed treatment options with patient today, including cortisone injection, NSAID course of treatment, stretching exercises, use of night splint, physical therapy, rest, icing the heel, arch supports/orthotics, and supportive shoe gear.    Felt heel lifts dispensed for his shoes, Voltaren gel recommended with deep tissue massage.  He was given stretching exercises.  Prescription for meloxicam 15 mg 1 tab p.o. daily sent to his pharmacy.  Will recheck in 3 to 4 weeks.  We also briefly discussed PRP injections.  Return in about 4 weeks (around 12/22/2023) for f/u achilles tendonitis R heel.   Abegail Kloeppel Orland Mustard, DPM, FACFAS  [facility]     [address] [place], [place] [address]                Office [phone]  Fax [phone]

## 2023-11-24 NOTE — Patient Instructions (Signed)

## 2023-12-11 ENCOUNTER — Ambulatory Visit (INDEPENDENT_AMBULATORY_CARE_PROVIDER_SITE_OTHER): Payer: Self-pay

## 2023-12-11 DIAGNOSIS — J309 Allergic rhinitis, unspecified: Secondary | ICD-10-CM | POA: Diagnosis not present

## 2023-12-22 ENCOUNTER — Ambulatory Visit: Payer: BC Managed Care – PPO | Admitting: Podiatry

## 2023-12-25 ENCOUNTER — Encounter: Payer: Self-pay | Admitting: Allergy and Immunology

## 2023-12-25 ENCOUNTER — Ambulatory Visit: Admitting: Allergy and Immunology

## 2023-12-25 VITALS — BP 130/88 | HR 64 | Resp 14 | Ht 73.5 in | Wt 238.2 lb

## 2023-12-25 DIAGNOSIS — J3089 Other allergic rhinitis: Secondary | ICD-10-CM | POA: Diagnosis not present

## 2023-12-25 DIAGNOSIS — J301 Allergic rhinitis due to pollen: Secondary | ICD-10-CM

## 2023-12-25 DIAGNOSIS — J309 Allergic rhinitis, unspecified: Secondary | ICD-10-CM

## 2023-12-25 NOTE — Patient Instructions (Signed)
  1.  Continue immunotherapy (and EpiPen)  2.  Return to clinic in 1 year or earlier if problem  3. Influenza = Tamiflu. Covid = Paxlovid

## 2023-12-25 NOTE — Progress Notes (Unsigned)
 [place] - [place] - [place] - [place]   Follow-up Note  Referring Provider: Paulina Fusi, MD Primary Provider: Paulina Fusi, MD Date of Office Visit: 12/25/2023  Subjective:   David Flores (DOB: 1966/09/03) is a 58 y.o. male who [facility]2023 in re-evaluation of the following:  HPI: David Flores returns to this clinic in reevaluation of allergic rhinoconjunctivitis.  I last saw him in this clinic 08 November 2021.  He is currently using his immunotherapy every 4 weeks without any adverse effect.  He continues to remain with no significant eye or nasal symptoms while using this form of treatment and does not require any other therapy.  Allergies as of 12/25/2023       Reactions   Iodine Other (See Comments)   "burns skin"   Penicillins         Medication List    enalapril 5 MG tablet Commonly known as: VASOTEC Take 5 mg by mouth daily.   EPINEPHrine 0.3 mg/0.3 mL Soaj injection Commonly known as: EPI-PEN Use as directed for life threatening allergic reactions    Past Medical History:  Diagnosis Date   Allergic rhinitis     Past Surgical History:  Procedure Laterality Date   CYST EXCISION Right    Cyst under eyelid    Review of systems negative except as noted in HPI / PMHx or noted below:  Review of Systems  Constitutional: Negative.   HENT: Negative.    Eyes: Negative.   Respiratory: Negative.    Cardiovascular: Negative.   Gastrointestinal: Negative.   Genitourinary: Negative.   Musculoskeletal: Negative.   Skin: Negative.   Neurological: Negative.   Endo/Heme/Allergies: Negative.   Psychiatric/Behavioral: Negative.       Objective:   Vitals:   12/25/23 0959  BP: 130/88  Pulse: 64  Resp: 14  SpO2: 97%   Height: 6' 1.5" (186.7 cm)  Weight: 238 lb 3.2 oz (108 kg)   Physical Exam Constitutional:      Appearance: He is not diaphoretic.  HENT:     Head: Normocephalic.      Right Ear: Tympanic membrane, ear canal and external ear normal.     Left Ear: Tympanic membrane, ear canal and external ear normal.     Nose: Nose normal. No mucosal edema or rhinorrhea.     Mouth/Throat:     Pharynx: Uvula midline. No oropharyngeal exudate.  Eyes:     Conjunctiva/sclera: Conjunctivae normal.  Neck:     Thyroid: No thyromegaly.     Trachea: Trachea normal. No tracheal tenderness or tracheal deviation.  Cardiovascular:     Rate and Rhythm: Normal rate and regular rhythm.     Heart sounds: Normal heart sounds, S1 normal and S2 normal. No murmur heard. Pulmonary:     Effort: No respiratory distress.     Breath sounds: Normal breath sounds. No stridor. No wheezing or rales.  Lymphadenopathy:     Head:     Right side of head: No tonsillar adenopathy.     Left side of head: No tonsillar adenopathy.     Cervical: No cervical adenopathy.  Skin:    Findings: No erythema or rash.     Nails: There is no clubbing.  Neurological:     Mental Status: He is alert.     Diagnostics: none  Assessment and Plan:   1. Perennial allergic rhinitis   2. Seasonal allergic rhinitis due to pollen  3. Allergic rhinitis, unspecified seasonality, unspecified trigger    1.  Continue immunotherapy (and EpiPen)  2.  Return to clinic in 1 year or earlier if problem  3. Influenza = Tamiflu. Covid = Paxlovid  David Flores is doing very well on immunotherapy which has resulted in rather significant improvement regarding his atopic disease and he will continue on this form of treatment and we will see him back in this clinic in 1 year or earlier if there is a problem.  Laurette Schimke, MD Allergy / Immunology [facility]

## 2023-12-26 ENCOUNTER — Encounter: Payer: Self-pay | Admitting: Allergy and Immunology

## 2024-01-09 ENCOUNTER — Ambulatory Visit (INDEPENDENT_AMBULATORY_CARE_PROVIDER_SITE_OTHER): Payer: Self-pay

## 2024-01-09 DIAGNOSIS — J309 Allergic rhinitis, unspecified: Secondary | ICD-10-CM | POA: Diagnosis not present

## 2024-02-05 ENCOUNTER — Ambulatory Visit (INDEPENDENT_AMBULATORY_CARE_PROVIDER_SITE_OTHER): Payer: Self-pay | Admitting: *Deleted

## 2024-02-05 DIAGNOSIS — J309 Allergic rhinitis, unspecified: Secondary | ICD-10-CM | POA: Diagnosis not present

## 2024-03-04 ENCOUNTER — Ambulatory Visit (INDEPENDENT_AMBULATORY_CARE_PROVIDER_SITE_OTHER): Payer: Self-pay | Admitting: *Deleted

## 2024-03-04 DIAGNOSIS — J309 Allergic rhinitis, unspecified: Secondary | ICD-10-CM

## 2024-04-03 ENCOUNTER — Ambulatory Visit (INDEPENDENT_AMBULATORY_CARE_PROVIDER_SITE_OTHER): Payer: Self-pay

## 2024-04-03 DIAGNOSIS — J309 Allergic rhinitis, unspecified: Secondary | ICD-10-CM | POA: Diagnosis not present

## 2024-04-29 ENCOUNTER — Ambulatory Visit (INDEPENDENT_AMBULATORY_CARE_PROVIDER_SITE_OTHER): Payer: Self-pay | Admitting: *Deleted

## 2024-04-29 DIAGNOSIS — J309 Allergic rhinitis, unspecified: Secondary | ICD-10-CM | POA: Diagnosis not present

## 2024-05-15 DIAGNOSIS — Z125 Encounter for screening for malignant neoplasm of prostate: Secondary | ICD-10-CM | POA: Diagnosis not present

## 2024-05-15 DIAGNOSIS — R7301 Impaired fasting glucose: Secondary | ICD-10-CM | POA: Diagnosis not present

## 2024-05-15 DIAGNOSIS — Z1331 Encounter for screening for depression: Secondary | ICD-10-CM | POA: Diagnosis not present

## 2024-05-15 DIAGNOSIS — I10 Essential (primary) hypertension: Secondary | ICD-10-CM | POA: Diagnosis not present

## 2024-05-15 DIAGNOSIS — E785 Hyperlipidemia, unspecified: Secondary | ICD-10-CM | POA: Diagnosis not present

## 2024-05-15 DIAGNOSIS — E291 Testicular hypofunction: Secondary | ICD-10-CM | POA: Diagnosis not present

## 2024-05-28 ENCOUNTER — Ambulatory Visit (INDEPENDENT_AMBULATORY_CARE_PROVIDER_SITE_OTHER): Payer: Self-pay | Admitting: *Deleted

## 2024-05-28 DIAGNOSIS — J309 Allergic rhinitis, unspecified: Secondary | ICD-10-CM

## 2024-06-18 NOTE — Progress Notes (Signed)
 VIALS MADE 06-18-24

## 2024-06-20 DIAGNOSIS — J3089 Other allergic rhinitis: Secondary | ICD-10-CM | POA: Diagnosis not present

## 2024-06-20 DIAGNOSIS — J301 Allergic rhinitis due to pollen: Secondary | ICD-10-CM | POA: Diagnosis not present

## 2024-06-20 DIAGNOSIS — J302 Other seasonal allergic rhinitis: Secondary | ICD-10-CM | POA: Diagnosis not present

## 2024-07-02 ENCOUNTER — Ambulatory Visit (INDEPENDENT_AMBULATORY_CARE_PROVIDER_SITE_OTHER): Payer: Self-pay

## 2024-07-02 DIAGNOSIS — J309 Allergic rhinitis, unspecified: Secondary | ICD-10-CM

## 2024-08-06 ENCOUNTER — Ambulatory Visit (INDEPENDENT_AMBULATORY_CARE_PROVIDER_SITE_OTHER): Payer: Self-pay | Admitting: *Deleted

## 2024-08-06 DIAGNOSIS — J309 Allergic rhinitis, unspecified: Secondary | ICD-10-CM

## 2024-08-19 ENCOUNTER — Ambulatory Visit (INDEPENDENT_AMBULATORY_CARE_PROVIDER_SITE_OTHER): Payer: Self-pay | Admitting: *Deleted

## 2024-08-19 DIAGNOSIS — J309 Allergic rhinitis, unspecified: Secondary | ICD-10-CM | POA: Diagnosis not present

## 2024-08-26 ENCOUNTER — Ambulatory Visit: Admitting: *Deleted

## 2024-08-26 DIAGNOSIS — J309 Allergic rhinitis, unspecified: Secondary | ICD-10-CM

## 2024-10-17 ENCOUNTER — Ambulatory Visit: Admitting: *Deleted

## 2024-10-17 DIAGNOSIS — J302 Other seasonal allergic rhinitis: Secondary | ICD-10-CM
